# Patient Record
Sex: Male | Born: 1950
Health system: Southern US, Community
[De-identification: ages and names within clinical notes are randomized; demographics above are authoritative.]

## PROBLEM LIST (undated history)

## (undated) DIAGNOSIS — E039 Hypothyroidism, unspecified: Secondary | ICD-10-CM

## (undated) HISTORY — DX: Hypothyroidism, unspecified: E03.9

## (undated) HISTORY — PX: TONSILLECTOMY: SUR1361

---

## 2000-09-23 ENCOUNTER — Encounter: Payer: Self-pay | Admitting: Family Medicine

## 2000-09-23 ENCOUNTER — Encounter: Admission: RE | Admit: 2000-09-23 | Discharge: 2000-09-23 | Payer: Self-pay | Admitting: Family Medicine

## 2003-01-26 ENCOUNTER — Ambulatory Visit (HOSPITAL_COMMUNITY): Admission: RE | Admit: 2003-01-26 | Discharge: 2003-01-26 | Payer: Self-pay | Admitting: Neurology

## 2003-01-26 ENCOUNTER — Encounter: Payer: Self-pay | Admitting: Neurology

## 2006-05-09 ENCOUNTER — Encounter: Admission: RE | Admit: 2006-05-09 | Discharge: 2006-05-09 | Payer: Self-pay | Admitting: Family Medicine

## 2007-11-27 ENCOUNTER — Encounter: Admission: RE | Admit: 2007-11-27 | Discharge: 2007-11-27 | Payer: Self-pay | Admitting: Family Medicine

## 2008-12-09 ENCOUNTER — Ambulatory Visit: Payer: Self-pay | Admitting: Cardiology

## 2008-12-09 ENCOUNTER — Encounter: Payer: Self-pay | Admitting: Cardiology

## 2008-12-09 DIAGNOSIS — R9431 Abnormal electrocardiogram [ECG] [EKG]: Secondary | ICD-10-CM | POA: Insufficient documentation

## 2008-12-09 DIAGNOSIS — R5381 Other malaise: Secondary | ICD-10-CM | POA: Insufficient documentation

## 2008-12-09 DIAGNOSIS — R0602 Shortness of breath: Secondary | ICD-10-CM | POA: Insufficient documentation

## 2008-12-09 DIAGNOSIS — R5383 Other fatigue: Secondary | ICD-10-CM

## 2008-12-09 DIAGNOSIS — E039 Hypothyroidism, unspecified: Secondary | ICD-10-CM | POA: Insufficient documentation

## 2008-12-19 ENCOUNTER — Ambulatory Visit: Payer: Self-pay

## 2008-12-19 ENCOUNTER — Encounter: Payer: Self-pay | Admitting: Cardiology

## 2009-05-11 ENCOUNTER — Encounter (INDEPENDENT_AMBULATORY_CARE_PROVIDER_SITE_OTHER): Payer: Self-pay | Admitting: *Deleted

## 2009-07-06 IMAGING — US US ABDOMEN COMPLETE
1 series · 14 of 25 positions shown · non-contrast
Comparison: Although the images are no longer available, the report for a
ultrasound study from 09/23/2000 has been reviewed.

ABDOMEN ULTRASOUND:

CLINICAL DATA: Pulsatile epigastric mass. Question abdominal aortic aneurysm.
TECHNIQUE: Complete abdominal ultrasound examination was performed including
evaluation of the liver, gallbladder, bile ducts, pancreas, kidneys, spleen,
IVC, and abdominal aorta.

[Series 1: us abdomen complete · 0.19mm/px · 14 of 102 slices shown]
[im 1/102]
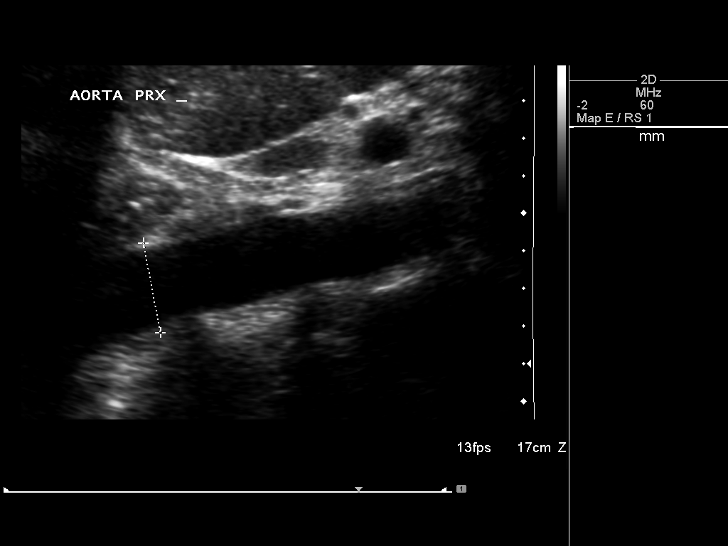
[im 9/102]
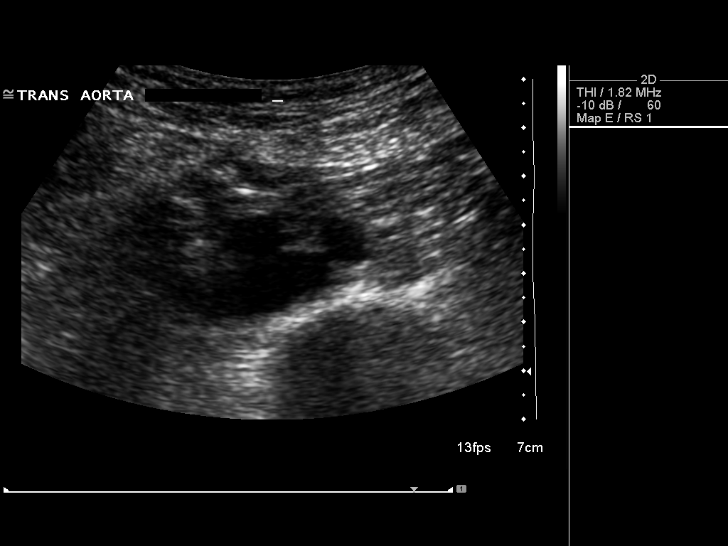
[im 17/102]
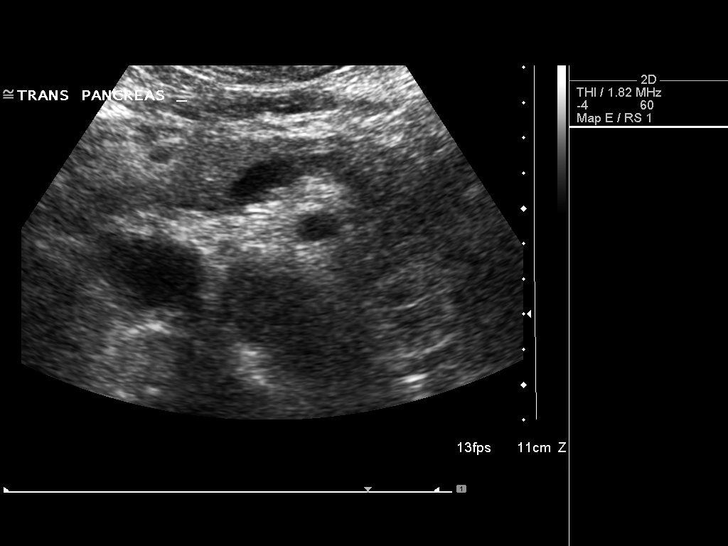
[im 26/102]
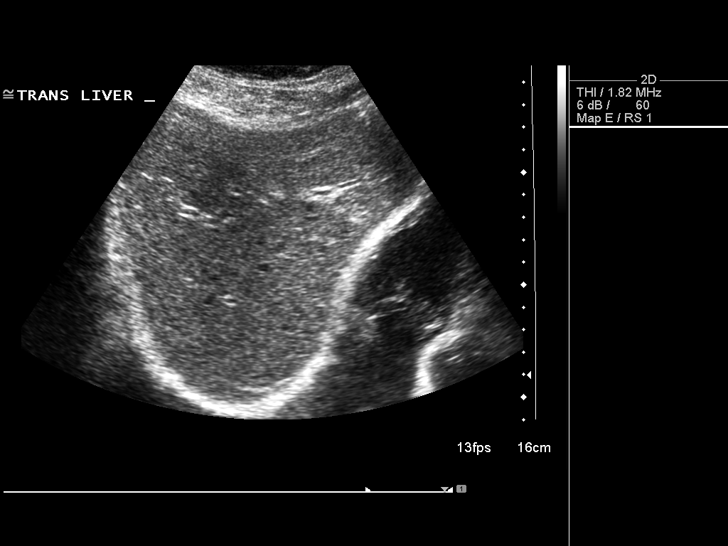
[im 34/102]
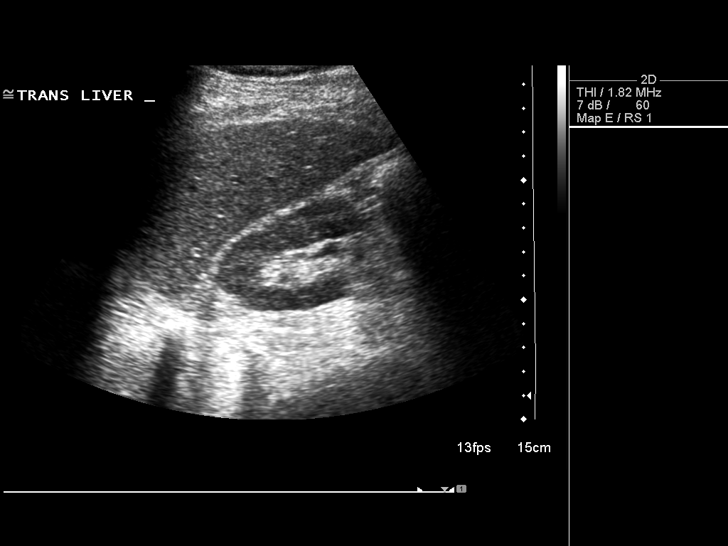
[im 38/102]
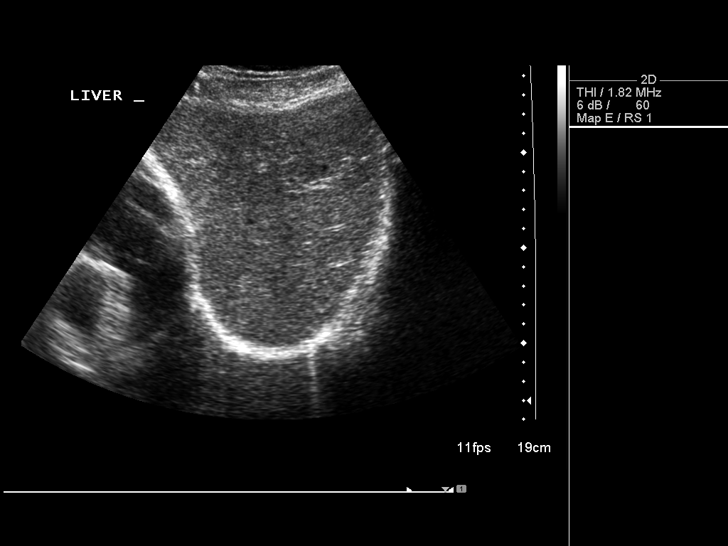
[im 47/102]
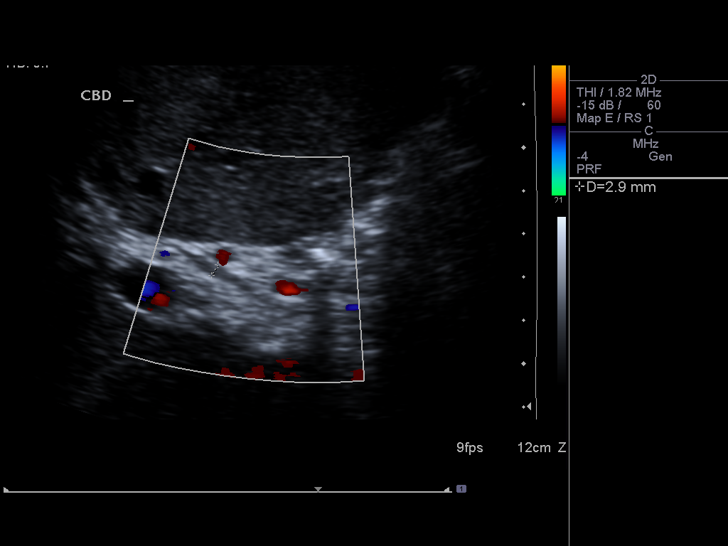
[im 55/102]
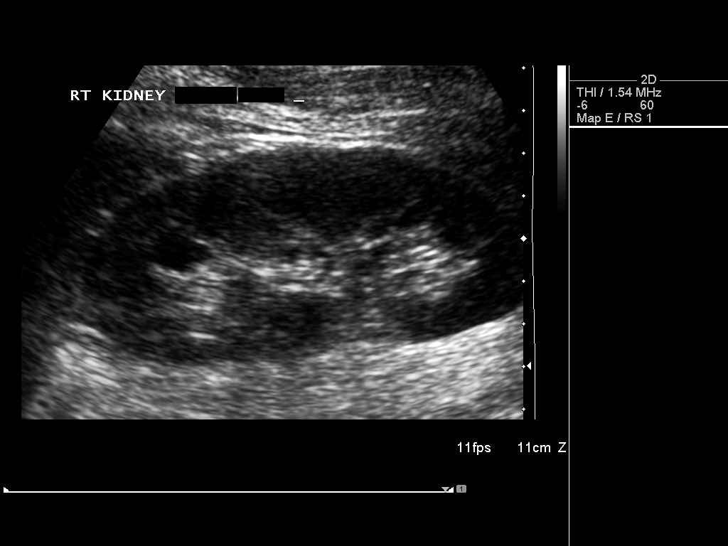
[im 64/102]
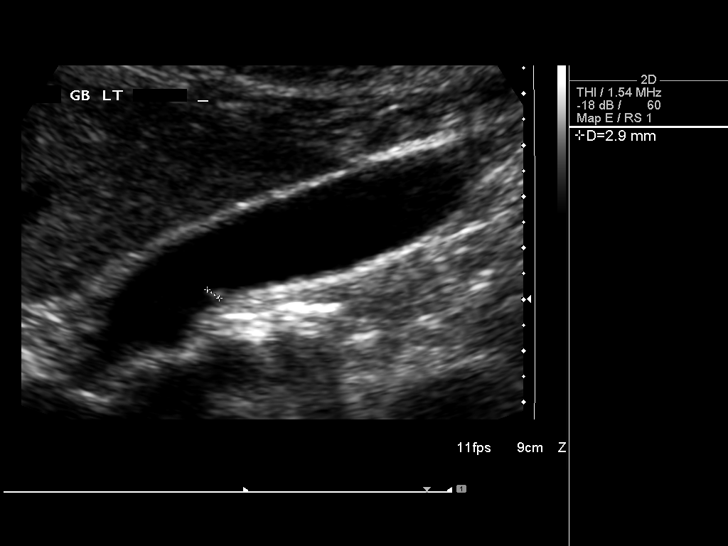
[im 68/102]
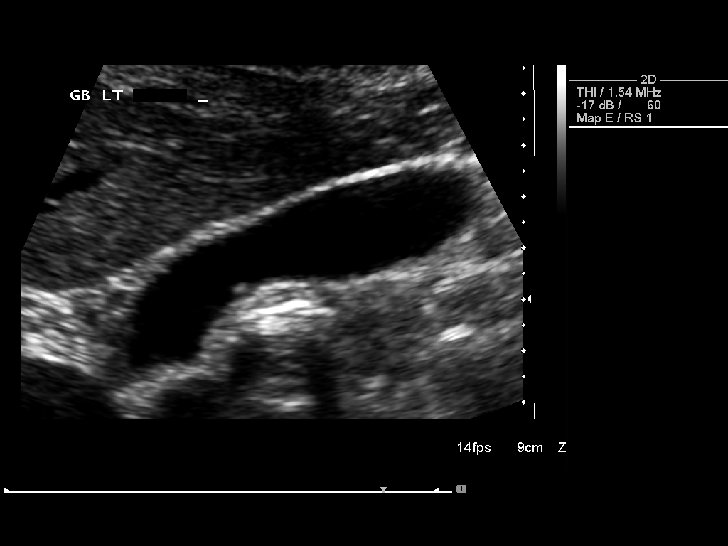
[im 76/102]
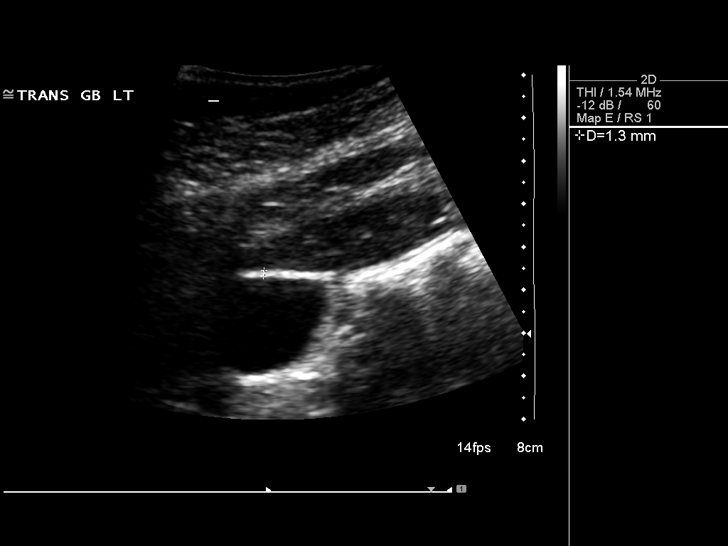
[im 85/102]
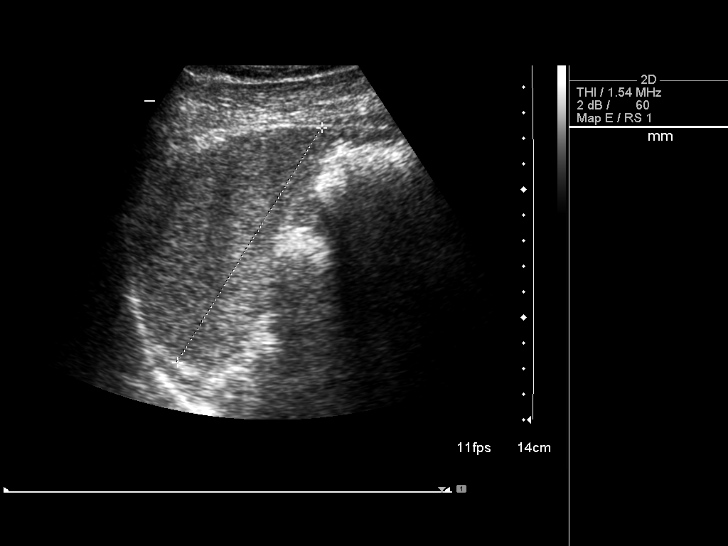
[im 93/102]
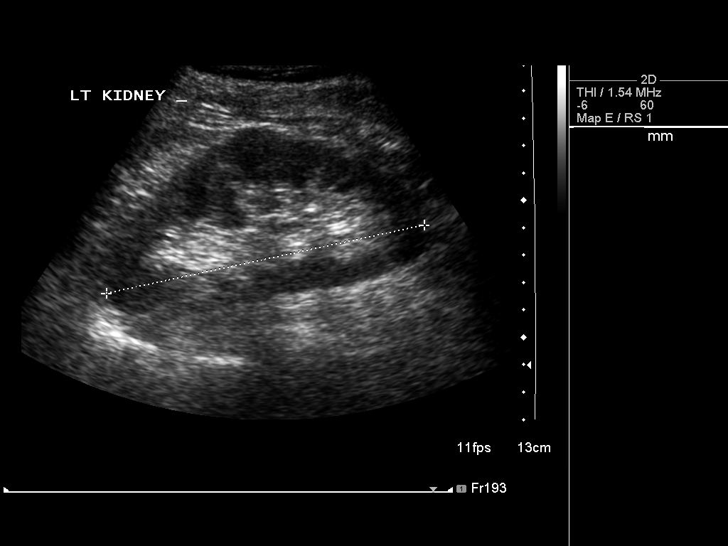
[im 102/102]
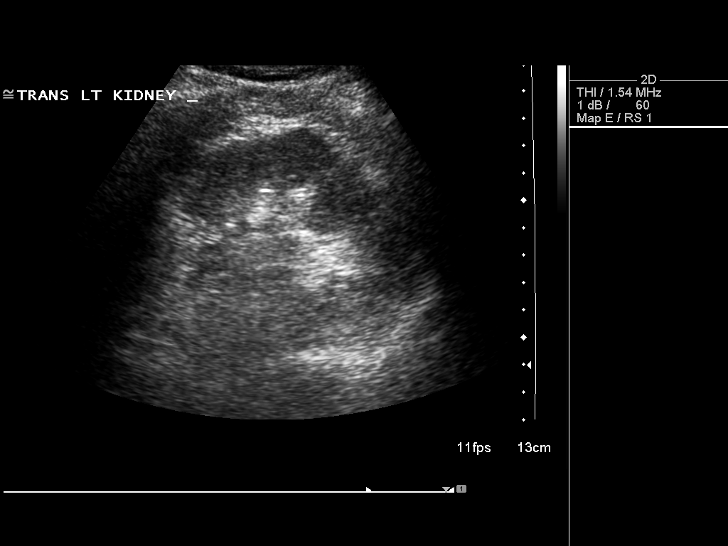

[14 of 25 positions shown; findings below may reference images not displayed]

FINDINGS: Gallbladder: Two tiny polyps are identified. Gallbladder polyp was described on
the previous study as well. There are no stones. No evidence for gallbladder
wall thickening.

Common Bile Duct:  Nondilated

Liver:  Normal

Inferior Vena Cava:  Normal

Pancreas:  Normal

Spleen: 10 mm hyperechoic focus within the superior pole of the spleen may be
related to a small calcified pseudocyst or granuloma.

Right Kidney:  11.8 cm in long axis.  14 mm cyst identified centrally in the
upper pole.

Left Kidney:  11.9 cm in long axis.  Normal.

Aorta:  Maximum obtainable abdominal aortic diameter is 2.4 cm.
IMPRESSION: No evidence for abdominal aortic aneurysm.

Tiny cholesterol polyps in the gallbladder.

Right upper pole renal cyst.

## 2009-07-21 ENCOUNTER — Encounter: Payer: Self-pay | Admitting: Cardiology

## 2010-01-19 ENCOUNTER — Ambulatory Visit: Payer: Self-pay | Admitting: Cardiology

## 2010-10-21 HISTORY — PX: COLONOSCOPY: SHX174

## 2010-11-20 NOTE — Assessment & Plan Note (Signed)
Summary: rov per pt call/lg   Primary Provider:  none  CC:  Abnormal EKG.  History of Present Illness: The patient presents for followup of an abnormal EKG. He has a right bundle branch block. Last year he had an echocardiogram to evaluate this and he was found not to have a normal left ventricular size and function and no valvular abnormalities. He returns for yearly followup of his conduction disturbance. Since that time he's had no symptomatic bradycardia arrhythmias. He's had no palpitations, presyncope or syncope. He's had no new shortness of breath though he says he does get dyspneic with exercise. He's had no chest pressure, neck or arm discomfort. He's had no weight gain or swelling. He works out aggressively but this is predominantly lifting weights rather than a rope. I did review blood work that he had done in October with an HDL of 58 and an LDL of 100.  Current Medications (verified): 1)  Zinc .... One By Mouth Daily 2)  Cytpzyme-Ad .... One By Mouth Daily 3)  Pantothenic Acid  Tabs (Pantothenic Acid) .... One By Mouth Daily 4)  Dh Ea 50 Mg .... One By Mouth Two Times A Day 5)  Vitamin C 4000mg  .... One By Mouth Daily 6)  Probiotic .... One By Mouth Every Other Day 7)  Red Yeast Rice .... One By Mouth Every Other Day 8)  Niacin .... One By Mouth Every Other Day 9)  Cialis .... One By Mouth As Needed 10)  Thyroid 2gr .... One By Mouth Daily 11)  Acyclovir 400 Mg Tabs (Acyclovir) .... One By Mouth Daily 12)  Testosterone Gel .Marland Kitchen.. 1% Daily 13)  Fish Oil .Marland Kitchen.. 3 Tablets Daily 14)  B 12 .... One By Mouth Daily 15)  Glucosamine .... Three Times A Day 16)  Calcium .... Three Times A Day 17)  Arimidex .Marland KitchenMarland Kitchen. 1 By Mouth Daily  Allergies (verified): No Known Drug Allergies  Past History:  Past Medical History: [place] spotted fever as a child Low testosterone Hypothyroidism Benign prostatic hypertrophy Recurrent HSV Right bundle branch block/left anterior fascicular  block  Past Surgical History: Tonsillectomy  Review of Systems       As stated in the HPI and negative for all other systems.   Vital Signs:  Patient profile:   60 year old male Height:      70 inches Weight:      201 pounds BMI:     28.94 Pulse rate:   71 / minute Resp:     16 per minute BP sitting:   134 / 82  (left arm)  Vitals Entered By: Marrion Coy, CNA (January 19, 2010 10:06 AM)  Physical Exam  General:  Well developed, well nourished, in no acute distress. Head:  normocephalic and atraumatic Eyes:  PERRLA/EOM intact; conjunctiva and lids normal. Mouth:  Teeth, gums and palate normal. Oral mucosa normal. Neck:  Neck supple, no JVD. No masses, thyromegaly or abnormal cervical nodes. Chest Wall:  no deformities or breast masses noted Lungs:  Clear bilaterally to auscultation and percussion. Abdomen:  Bowel sounds positive; abdomen soft and non-tender without masses, organomegaly, or hernias noted. No hepatosplenomegaly. Msk:  Back normal, normal gait. Muscle strength and tone normal. Extremities:  No clubbing or cyanosis. Neurologic:  Alert and oriented x 3. Skin:  Intact without lesions or rashes. Cervical Nodes:  no significant adenopathy Axillary Nodes:  no significant adenopathy Inguinal Nodes:  no significant adenopathy Psych:  Normal affect.   Detailed Cardiovascular  Exam  Neck    Carotids: Carotids full and equal bilaterally without bruits.      Neck Veins: Normal, no JVD.    Heart    Inspection: no deformities or lifts noted.      Palpation: normal PMI with no thrills palpable.      Auscultation: regular rate and rhythm, S1, S2 without murmurs, rubs, gallops, or clicks.    Vascular    Abdominal Aorta: no palpable masses, pulsations, or audible bruits.      Femoral Pulses: normal femoral pulses bilaterally.      Pedal Pulses: normal pedal pulses bilaterally.      Radial Pulses: normal radial pulses bilaterally.      Peripheral Circulation: no  clubbing, cyanosis, or edema noted with normal capillary refill.     Impression & Recommendations:  Problem # 1:  ABNORMAL ELECTROCARDIOGRAM (ICD-794.31) We discuss this again at length. At this point there is no indication for the need for further testing. He has no new complaints and has a structurally normal heart. He understands the symptoms that could occur if he has worsening conduction disturbance. Orders: EKG w/ Interpretation (93000)  Problem # 2:  FATIGUE (ICD-780.79) This he is a continued complaint. He's had extensive laboratory workup to include hormonal evaluation and thyroid. His TSH is actually low. At this point I do not suggest a cardiac etiology. On a side note we spent quite a bit of time presents is greater than 30 minutes of this appointment) discussing the science behind the many nutraceuticals that he takes.  Problem # 3:  DYSPNEA (ICD-786.05) This is mild and not progressive.  I asked him to increase his exercise to see if this improves.  We also discussed the need to add aerobic exercise to reduce cardiovascular risk.  Patient Instructions: 1)  Your physician recommends that you continue on your current medications as directed. Please refer to the Current Medication list given to you today. 2)  Your physician recommends that you schedule a follow-up appointment in: 1 year

## 2012-11-02 ENCOUNTER — Encounter: Payer: Self-pay | Admitting: Internal Medicine

## 2013-07-12 ENCOUNTER — Encounter: Payer: Self-pay | Admitting: Internal Medicine

## 2015-10-24 DIAGNOSIS — R7989 Other specified abnormal findings of blood chemistry: Secondary | ICD-10-CM | POA: Diagnosis not present

## 2016-02-14 DIAGNOSIS — R69 Illness, unspecified: Secondary | ICD-10-CM | POA: Diagnosis not present

## 2016-02-20 DIAGNOSIS — S8391XA Sprain of unspecified site of right knee, initial encounter: Secondary | ICD-10-CM | POA: Diagnosis not present

## 2016-02-20 DIAGNOSIS — M546 Pain in thoracic spine: Secondary | ICD-10-CM | POA: Diagnosis not present

## 2016-02-20 DIAGNOSIS — S335XXA Sprain of ligaments of lumbar spine, initial encounter: Secondary | ICD-10-CM | POA: Diagnosis not present

## 2016-02-20 DIAGNOSIS — S134XXA Sprain of ligaments of cervical spine, initial encounter: Secondary | ICD-10-CM | POA: Diagnosis not present

## 2016-04-16 DIAGNOSIS — E559 Vitamin D deficiency, unspecified: Secondary | ICD-10-CM | POA: Diagnosis not present

## 2016-04-16 DIAGNOSIS — R7989 Other specified abnormal findings of blood chemistry: Secondary | ICD-10-CM | POA: Diagnosis not present

## 2016-04-16 DIAGNOSIS — R5383 Other fatigue: Secondary | ICD-10-CM | POA: Diagnosis not present

## 2016-07-08 DIAGNOSIS — D485 Neoplasm of uncertain behavior of skin: Secondary | ICD-10-CM | POA: Diagnosis not present

## 2016-07-08 DIAGNOSIS — L821 Other seborrheic keratosis: Secondary | ICD-10-CM | POA: Diagnosis not present

## 2016-07-08 DIAGNOSIS — L814 Other melanin hyperpigmentation: Secondary | ICD-10-CM | POA: Diagnosis not present

## 2016-10-16 DIAGNOSIS — S335XXA Sprain of ligaments of lumbar spine, initial encounter: Secondary | ICD-10-CM | POA: Diagnosis not present

## 2016-10-16 DIAGNOSIS — M546 Pain in thoracic spine: Secondary | ICD-10-CM | POA: Diagnosis not present

## 2016-10-16 DIAGNOSIS — S8391XA Sprain of unspecified site of right knee, initial encounter: Secondary | ICD-10-CM | POA: Diagnosis not present

## 2016-10-16 DIAGNOSIS — S134XXA Sprain of ligaments of cervical spine, initial encounter: Secondary | ICD-10-CM | POA: Diagnosis not present

## 2016-10-17 DIAGNOSIS — R7989 Other specified abnormal findings of blood chemistry: Secondary | ICD-10-CM | POA: Diagnosis not present

## 2016-10-17 DIAGNOSIS — R5383 Other fatigue: Secondary | ICD-10-CM | POA: Diagnosis not present

## 2016-10-17 DIAGNOSIS — R972 Elevated prostate specific antigen [PSA]: Secondary | ICD-10-CM | POA: Diagnosis not present

## 2016-10-17 DIAGNOSIS — E559 Vitamin D deficiency, unspecified: Secondary | ICD-10-CM | POA: Diagnosis not present

## 2017-03-25 DIAGNOSIS — E291 Testicular hypofunction: Secondary | ICD-10-CM | POA: Diagnosis not present

## 2017-03-25 DIAGNOSIS — N5201 Erectile dysfunction due to arterial insufficiency: Secondary | ICD-10-CM | POA: Diagnosis not present

## 2017-04-15 DIAGNOSIS — Z6826 Body mass index (BMI) 26.0-26.9, adult: Secondary | ICD-10-CM | POA: Diagnosis not present

## 2017-04-15 DIAGNOSIS — M7022 Olecranon bursitis, left elbow: Secondary | ICD-10-CM | POA: Diagnosis not present

## 2017-05-14 DIAGNOSIS — J3089 Other allergic rhinitis: Secondary | ICD-10-CM | POA: Diagnosis not present

## 2017-05-14 DIAGNOSIS — R5383 Other fatigue: Secondary | ICD-10-CM | POA: Diagnosis not present

## 2017-05-14 DIAGNOSIS — Z79890 Hormone replacement therapy (postmenopausal): Secondary | ICD-10-CM | POA: Diagnosis not present

## 2017-05-14 DIAGNOSIS — E7211 Homocystinuria: Secondary | ICD-10-CM | POA: Diagnosis not present

## 2017-05-14 DIAGNOSIS — D899 Disorder involving the immune mechanism, unspecified: Secondary | ICD-10-CM | POA: Diagnosis not present

## 2017-05-14 DIAGNOSIS — R748 Abnormal levels of other serum enzymes: Secondary | ICD-10-CM | POA: Diagnosis not present

## 2017-05-14 DIAGNOSIS — R7982 Elevated C-reactive protein (CRP): Secondary | ICD-10-CM | POA: Diagnosis not present

## 2017-05-14 DIAGNOSIS — E291 Testicular hypofunction: Secondary | ICD-10-CM | POA: Diagnosis not present

## 2017-05-14 DIAGNOSIS — Z125 Encounter for screening for malignant neoplasm of prostate: Secondary | ICD-10-CM | POA: Diagnosis not present

## 2017-07-01 DIAGNOSIS — H25011 Cortical age-related cataract, right eye: Secondary | ICD-10-CM | POA: Diagnosis not present

## 2017-07-01 DIAGNOSIS — H52229 Regular astigmatism, unspecified eye: Secondary | ICD-10-CM | POA: Diagnosis not present

## 2017-07-01 DIAGNOSIS — Z0100 Encounter for examination of eyes and vision without abnormal findings: Secondary | ICD-10-CM | POA: Diagnosis not present

## 2017-08-08 DIAGNOSIS — E7211 Homocystinuria: Secondary | ICD-10-CM | POA: Diagnosis not present

## 2017-08-08 DIAGNOSIS — Z79890 Hormone replacement therapy (postmenopausal): Secondary | ICD-10-CM | POA: Diagnosis not present

## 2017-08-08 DIAGNOSIS — E291 Testicular hypofunction: Secondary | ICD-10-CM | POA: Diagnosis not present

## 2017-08-08 DIAGNOSIS — N401 Enlarged prostate with lower urinary tract symptoms: Secondary | ICD-10-CM | POA: Diagnosis not present

## 2017-08-08 DIAGNOSIS — E039 Hypothyroidism, unspecified: Secondary | ICD-10-CM | POA: Diagnosis not present

## 2017-10-07 DIAGNOSIS — E291 Testicular hypofunction: Secondary | ICD-10-CM | POA: Diagnosis not present

## 2017-10-08 DIAGNOSIS — S335XXA Sprain of ligaments of lumbar spine, initial encounter: Secondary | ICD-10-CM | POA: Diagnosis not present

## 2017-10-08 DIAGNOSIS — S134XXA Sprain of ligaments of cervical spine, initial encounter: Secondary | ICD-10-CM | POA: Diagnosis not present

## 2017-10-08 DIAGNOSIS — M546 Pain in thoracic spine: Secondary | ICD-10-CM | POA: Diagnosis not present

## 2017-11-12 DIAGNOSIS — R69 Illness, unspecified: Secondary | ICD-10-CM | POA: Diagnosis not present

## 2017-11-26 DIAGNOSIS — E559 Vitamin D deficiency, unspecified: Secondary | ICD-10-CM | POA: Diagnosis not present

## 2017-11-26 DIAGNOSIS — E291 Testicular hypofunction: Secondary | ICD-10-CM | POA: Diagnosis not present

## 2017-11-26 DIAGNOSIS — E039 Hypothyroidism, unspecified: Secondary | ICD-10-CM | POA: Diagnosis not present

## 2017-11-26 DIAGNOSIS — Z79890 Hormone replacement therapy (postmenopausal): Secondary | ICD-10-CM | POA: Diagnosis not present

## 2017-12-16 DIAGNOSIS — R69 Illness, unspecified: Secondary | ICD-10-CM | POA: Diagnosis not present

## 2018-03-10 DIAGNOSIS — E039 Hypothyroidism, unspecified: Secondary | ICD-10-CM | POA: Diagnosis not present

## 2018-03-10 DIAGNOSIS — E291 Testicular hypofunction: Secondary | ICD-10-CM | POA: Diagnosis not present

## 2018-04-16 DIAGNOSIS — E291 Testicular hypofunction: Secondary | ICD-10-CM | POA: Diagnosis not present

## 2018-04-16 DIAGNOSIS — M47816 Spondylosis without myelopathy or radiculopathy, lumbar region: Secondary | ICD-10-CM | POA: Diagnosis not present

## 2018-04-16 DIAGNOSIS — E039 Hypothyroidism, unspecified: Secondary | ICD-10-CM | POA: Diagnosis not present

## 2018-04-16 DIAGNOSIS — M9901 Segmental and somatic dysfunction of cervical region: Secondary | ICD-10-CM | POA: Diagnosis not present

## 2018-04-16 DIAGNOSIS — M9903 Segmental and somatic dysfunction of lumbar region: Secondary | ICD-10-CM | POA: Diagnosis not present

## 2018-04-16 DIAGNOSIS — M546 Pain in thoracic spine: Secondary | ICD-10-CM | POA: Diagnosis not present

## 2018-04-16 DIAGNOSIS — M9902 Segmental and somatic dysfunction of thoracic region: Secondary | ICD-10-CM | POA: Diagnosis not present

## 2018-04-16 DIAGNOSIS — Z79890 Hormone replacement therapy (postmenopausal): Secondary | ICD-10-CM | POA: Diagnosis not present

## 2018-04-16 DIAGNOSIS — M47812 Spondylosis without myelopathy or radiculopathy, cervical region: Secondary | ICD-10-CM | POA: Diagnosis not present

## 2018-04-22 DIAGNOSIS — D225 Melanocytic nevi of trunk: Secondary | ICD-10-CM | POA: Diagnosis not present

## 2018-04-22 DIAGNOSIS — D485 Neoplasm of uncertain behavior of skin: Secondary | ICD-10-CM | POA: Diagnosis not present

## 2018-04-22 DIAGNOSIS — L720 Epidermal cyst: Secondary | ICD-10-CM | POA: Diagnosis not present

## 2018-07-03 DIAGNOSIS — Z6826 Body mass index (BMI) 26.0-26.9, adult: Secondary | ICD-10-CM | POA: Diagnosis not present

## 2018-07-03 DIAGNOSIS — G44219 Episodic tension-type headache, not intractable: Secondary | ICD-10-CM | POA: Diagnosis not present

## 2018-07-03 DIAGNOSIS — J309 Allergic rhinitis, unspecified: Secondary | ICD-10-CM | POA: Diagnosis not present

## 2018-07-03 DIAGNOSIS — H538 Other visual disturbances: Secondary | ICD-10-CM | POA: Diagnosis not present

## 2018-07-13 DIAGNOSIS — H25011 Cortical age-related cataract, right eye: Secondary | ICD-10-CM | POA: Diagnosis not present

## 2018-07-13 DIAGNOSIS — H52229 Regular astigmatism, unspecified eye: Secondary | ICD-10-CM | POA: Diagnosis not present

## 2018-07-22 DIAGNOSIS — E781 Pure hyperglyceridemia: Secondary | ICD-10-CM | POA: Diagnosis not present

## 2018-07-22 DIAGNOSIS — E7889 Other lipoprotein metabolism disorders: Secondary | ICD-10-CM | POA: Diagnosis not present

## 2018-07-22 DIAGNOSIS — Z13220 Encounter for screening for lipoid disorders: Secondary | ICD-10-CM | POA: Diagnosis not present

## 2018-07-22 DIAGNOSIS — Z0000 Encounter for general adult medical examination without abnormal findings: Secondary | ICD-10-CM | POA: Diagnosis not present

## 2018-07-22 DIAGNOSIS — Z6826 Body mass index (BMI) 26.0-26.9, adult: Secondary | ICD-10-CM | POA: Diagnosis not present

## 2018-08-03 DIAGNOSIS — M546 Pain in thoracic spine: Secondary | ICD-10-CM | POA: Diagnosis not present

## 2018-08-03 DIAGNOSIS — M9902 Segmental and somatic dysfunction of thoracic region: Secondary | ICD-10-CM | POA: Diagnosis not present

## 2018-08-03 DIAGNOSIS — M47816 Spondylosis without myelopathy or radiculopathy, lumbar region: Secondary | ICD-10-CM | POA: Diagnosis not present

## 2018-08-03 DIAGNOSIS — M9903 Segmental and somatic dysfunction of lumbar region: Secondary | ICD-10-CM | POA: Diagnosis not present

## 2018-08-03 DIAGNOSIS — M9901 Segmental and somatic dysfunction of cervical region: Secondary | ICD-10-CM | POA: Diagnosis not present

## 2018-08-03 DIAGNOSIS — M47812 Spondylosis without myelopathy or radiculopathy, cervical region: Secondary | ICD-10-CM | POA: Diagnosis not present

## 2018-08-14 DIAGNOSIS — E531 Pyridoxine deficiency: Secondary | ICD-10-CM | POA: Diagnosis not present

## 2018-08-14 DIAGNOSIS — D513 Other dietary vitamin B12 deficiency anemia: Secondary | ICD-10-CM | POA: Diagnosis not present

## 2018-08-14 DIAGNOSIS — E039 Hypothyroidism, unspecified: Secondary | ICD-10-CM | POA: Diagnosis not present

## 2018-08-14 DIAGNOSIS — D528 Other folate deficiency anemias: Secondary | ICD-10-CM | POA: Diagnosis not present

## 2018-08-14 DIAGNOSIS — E559 Vitamin D deficiency, unspecified: Secondary | ICD-10-CM | POA: Diagnosis not present

## 2018-08-14 DIAGNOSIS — R5382 Chronic fatigue, unspecified: Secondary | ICD-10-CM | POA: Diagnosis not present

## 2018-08-14 DIAGNOSIS — E7141 Primary carnitine deficiency: Secondary | ICD-10-CM | POA: Diagnosis not present

## 2018-09-10 DIAGNOSIS — E7212 Methylenetetrahydrofolate reductase deficiency: Secondary | ICD-10-CM | POA: Diagnosis not present

## 2018-09-10 DIAGNOSIS — E782 Mixed hyperlipidemia: Secondary | ICD-10-CM | POA: Diagnosis not present

## 2018-09-10 DIAGNOSIS — Z79890 Hormone replacement therapy (postmenopausal): Secondary | ICD-10-CM | POA: Diagnosis not present

## 2018-09-10 DIAGNOSIS — E559 Vitamin D deficiency, unspecified: Secondary | ICD-10-CM | POA: Diagnosis not present

## 2018-09-10 DIAGNOSIS — E756 Lipid storage disorder, unspecified: Secondary | ICD-10-CM | POA: Diagnosis not present

## 2018-09-10 DIAGNOSIS — E291 Testicular hypofunction: Secondary | ICD-10-CM | POA: Diagnosis not present

## 2018-09-10 DIAGNOSIS — R5382 Chronic fatigue, unspecified: Secondary | ICD-10-CM | POA: Diagnosis not present

## 2018-09-10 DIAGNOSIS — E039 Hypothyroidism, unspecified: Secondary | ICD-10-CM | POA: Diagnosis not present

## 2018-11-18 DIAGNOSIS — R69 Illness, unspecified: Secondary | ICD-10-CM | POA: Diagnosis not present

## 2018-11-23 DIAGNOSIS — R69 Illness, unspecified: Secondary | ICD-10-CM | POA: Diagnosis not present

## 2018-12-10 DIAGNOSIS — M546 Pain in thoracic spine: Secondary | ICD-10-CM | POA: Diagnosis not present

## 2018-12-10 DIAGNOSIS — M47816 Spondylosis without myelopathy or radiculopathy, lumbar region: Secondary | ICD-10-CM | POA: Diagnosis not present

## 2018-12-10 DIAGNOSIS — M9902 Segmental and somatic dysfunction of thoracic region: Secondary | ICD-10-CM | POA: Diagnosis not present

## 2018-12-10 DIAGNOSIS — M47812 Spondylosis without myelopathy or radiculopathy, cervical region: Secondary | ICD-10-CM | POA: Diagnosis not present

## 2018-12-10 DIAGNOSIS — M9901 Segmental and somatic dysfunction of cervical region: Secondary | ICD-10-CM | POA: Diagnosis not present

## 2018-12-10 DIAGNOSIS — M9903 Segmental and somatic dysfunction of lumbar region: Secondary | ICD-10-CM | POA: Diagnosis not present

## 2019-01-28 DIAGNOSIS — E291 Testicular hypofunction: Secondary | ICD-10-CM | POA: Diagnosis not present

## 2019-01-28 DIAGNOSIS — Z79890 Hormone replacement therapy (postmenopausal): Secondary | ICD-10-CM | POA: Diagnosis not present

## 2019-01-28 DIAGNOSIS — R5383 Other fatigue: Secondary | ICD-10-CM | POA: Diagnosis not present

## 2019-03-30 DIAGNOSIS — M9902 Segmental and somatic dysfunction of thoracic region: Secondary | ICD-10-CM | POA: Diagnosis not present

## 2019-03-30 DIAGNOSIS — M546 Pain in thoracic spine: Secondary | ICD-10-CM | POA: Diagnosis not present

## 2019-03-30 DIAGNOSIS — M47812 Spondylosis without myelopathy or radiculopathy, cervical region: Secondary | ICD-10-CM | POA: Diagnosis not present

## 2019-03-30 DIAGNOSIS — M47816 Spondylosis without myelopathy or radiculopathy, lumbar region: Secondary | ICD-10-CM | POA: Diagnosis not present

## 2019-03-30 DIAGNOSIS — M9903 Segmental and somatic dysfunction of lumbar region: Secondary | ICD-10-CM | POA: Diagnosis not present

## 2019-03-30 DIAGNOSIS — M9901 Segmental and somatic dysfunction of cervical region: Secondary | ICD-10-CM | POA: Diagnosis not present

## 2019-03-31 DIAGNOSIS — E039 Hypothyroidism, unspecified: Secondary | ICD-10-CM | POA: Diagnosis not present

## 2019-03-31 DIAGNOSIS — R079 Chest pain, unspecified: Secondary | ICD-10-CM | POA: Diagnosis not present

## 2019-03-31 DIAGNOSIS — Z6825 Body mass index (BMI) 25.0-25.9, adult: Secondary | ICD-10-CM | POA: Diagnosis not present

## 2019-03-31 DIAGNOSIS — I4510 Unspecified right bundle-branch block: Secondary | ICD-10-CM | POA: Diagnosis not present

## 2019-04-05 ENCOUNTER — Encounter: Payer: Self-pay | Admitting: *Deleted

## 2019-04-06 ENCOUNTER — Encounter: Payer: Self-pay | Admitting: Cardiology

## 2019-04-06 NOTE — Progress Notes (Signed)
Cardiology Office Note  Date: 04/07/2019   ID: Jonathan Boone, DOB 1951/03/30, MRN [id number]  PCP:  Jonathan Levy, PA  Consulting Cardiologist:  Rozann Lesches, MD Electrophysiologist:  None   Chief Complaint  Patient presents with  . Right scapular pain and diaphoresis    History of Present Illness: Jonathan Boone is a 68 y.o. male referred for cardiology consultation by Mr. Worley PA-C for the evaluation intermittent diaphoresis as well as right shoulder discomfort.  He states that a little over a week ago he was out shooting target practice and began to experience a right scapular/posterior thoracic discomfort.  This waxed and waned, recurred at nighttime and woke him up and was associated with diaphoresis.  He has had intermittent episodes of this during the week but not progressive, he states that it almost feels like he pulled a muscle.  He does lift weights, states that he increased his weight last week and was feeling good when he exercise.  He remains active, cut his grass this past Friday without symptoms.  He has not undergone any ischemic testing and has no history of known heart disease.  Records indicate evaluation with Dr. Percival Spanish back in 2011 for ECG with right bundle branch block pattern.  Previous echocardiogram from 2010 revealed normal LVEF at 60% with mildly calcified aortic valve with mild left atrial enlargement.  He reports no known history of hypertension, diabetes mellitus, or hyperlipidemia.  He is on treatment for hypothyroidism.  Past Medical History:  Diagnosis Date  . Hypothyroidism     Past Surgical History:  Procedure Laterality Date  . COLONOSCOPY  2012  . TONSILLECTOMY      Current Outpatient Medications  Medication Sig Dispense Refill  . acyclovir (ZOVIRAX) 400 MG tablet Take 400 mg by mouth daily.    . Alprostadil (PROSTAGLANDIN E1) POWD by Does not apply route.    Marland Kitchen arginine 500 MG tablet Take 500 mg by mouth daily.    . Magnesium  Gluconate (MAGNESIUM 27 PO) Take by mouth.    . Multiple Vitamin (MULTIVITAMIN) tablet Take 1 tablet by mouth daily.    . Multiple Vitamins-Minerals (PRESERVISION AREDS PO) Take by mouth.    . TESTOSTERONE PROPIONATE TD Place onto the skin.    Marland Kitchen thyroid (ARMOUR) 65 MG tablet Take 65 mg by mouth daily.    Marland Kitchen VITAMIN D PO Take by mouth.     No current facility-administered medications for this visit.    Allergies:  Patient has no known allergies.   Social History: The patient  reports that he has never smoked. He has never used smokeless tobacco. He reports that he does not drink alcohol or use drugs.   Family History: The patient's family history includes Alcohol abuse in his paternal grandfather; Diabetes Mellitus II in his mother; Heart attack in his paternal grandfather; Leukemia in his father.   ROS:  Please see the history of present illness. Otherwise, complete review of systems is positive for none.  All other systems are reviewed and negative.   Physical Exam: VS:  BP 124/78   Pulse 70   Ht 5\' 10"  (1.778 m)   Wt 188 lb (85.3 kg)   SpO2 97%   BMI 26.98 kg/m , BMI Body mass index is 26.98 kg/m.  Wt Readings from Last 3 Encounters:  04/07/19 188 lb (85.3 kg)  03/31/19 184 lb 4.8 oz (83.6 kg)  12/09/08 188 lb (85.3 kg)    General: Patient appears comfortable at  rest. HEENT: Conjunctiva and lids normal, oropharynx clear. Neck: Supple, no elevated JVP or carotid bruits, no thyromegaly. Lungs: Clear to auscultation, nonlabored breathing at rest. Cardiac: Regular rate and rhythm, no S3 or significant systolic murmur, no pericardial rub. Abdomen: Soft, nontender, bowel sounds present, no guarding or rebound. Extremities: No pitting edema, distal pulses 2+. Skin: Warm and dry. Musculoskeletal: No kyphosis. Neuropsychiatric: Alert and oriented x3, affect grossly appropriate.  ECG:  An ECG dated 03/31/2019 was personally reviewed today and demonstrated:  Normal sinus rhythm with  right bundle branch block and left anterior fascicular block based on axis.  Recent Labwork:  June 2020: BUN 18, creatinine 0.95, potassium 4.8, AST 12, ALT 12, hemoglobin 16.9, platelets 245, TSH 0.673  Other Studies Reviewed Today:  Echocardiogram 02/18/2009: SUMMARY  - Overall left ventricular systolic function was normal. Left     ventricular ejection fraction was estimated to be 60 %.  - The aortic valve was mildly calcified.  - The left atrium was mildly dilated.   Assessment and Plan:  1.  Recent recurrent episodes of right scapular discomfort with intermittent diaphoresis in a 68 year old male.  He reports no purely exertional component but does have some baseline degree of shortness of breath.  I reviewed his ECG which shows an old right bundle branch block, also left anterior fascicular block.  He has no known history of ischemic heart disease but has not undergone any objective ischemic testing.  Plan is to proceed with an echocardiogram for cardiac structural assessment and also an exercise Myoview (he prefers to avoid pharmacologic stress testing).  2.  Hypothyroidism followed by PCP, currently on Armour Thyroid.  Recent TSH normal range.  Medication Adjustments/Labs and Tests Ordered: Current medicines are reviewed at length with the patient today.  Concerns regarding medicines are outlined above.   Tests Ordered: Orders Placed This Encounter  Procedures  . NM Myocar Multi W/Spect W/Wall Motion / EF  . ECHOCARDIOGRAM COMPLETE    Medication Changes: No orders of the defined types were placed in this encounter.   Disposition:  Follow up test results.  Signed, Satira Sark, MD, [facility] 04/07/2019 9:32 AM    [facility] [address], [place], [place] [address] Phone: [phone]; Fax: [phone]

## 2019-04-07 ENCOUNTER — Other Ambulatory Visit: Payer: Self-pay

## 2019-04-07 ENCOUNTER — Encounter: Payer: Self-pay | Admitting: Cardiology

## 2019-04-07 ENCOUNTER — Encounter: Payer: Self-pay | Admitting: *Deleted

## 2019-04-07 ENCOUNTER — Ambulatory Visit: Payer: Medicare HMO | Admitting: Cardiology

## 2019-04-07 ENCOUNTER — Telehealth: Payer: Self-pay | Admitting: Cardiology

## 2019-04-07 VITALS — BP 124/78 | HR 70 | Ht 70.0 in | Wt 188.0 lb

## 2019-04-07 DIAGNOSIS — R9431 Abnormal electrocardiogram [ECG] [EKG]: Secondary | ICD-10-CM | POA: Diagnosis not present

## 2019-04-07 DIAGNOSIS — E039 Hypothyroidism, unspecified: Secondary | ICD-10-CM | POA: Diagnosis not present

## 2019-04-07 DIAGNOSIS — R0789 Other chest pain: Secondary | ICD-10-CM

## 2019-04-07 DIAGNOSIS — R0602 Shortness of breath: Secondary | ICD-10-CM | POA: Diagnosis not present

## 2019-04-07 NOTE — Telephone Encounter (Signed)
Pre-cert Verification for the following procedure    Exercise Myoview & Echo scheduled for 04-08-2019 at [facility].

## 2019-04-07 NOTE — Patient Instructions (Addendum)
Medication Instructions:   Your physician recommends that you continue on your current medications as directed. Please refer to the Current Medication list given to you today.  Labwork:  NONE  Testing/Procedures: Your physician has requested that you have an echocardiogram. Echocardiography is a painless test that uses sound waves to create images of your heart. It provides your doctor with information about the size and shape of your heart and how well your heart's chambers and valves are working. This procedure takes approximately one hour. There are no restrictions for this procedure. Your physician has requested that you have en exercise stress myoview. For further information please visit [email or web address]. Please follow instruction sheet, as given.  Follow-Up:  Your physician recommends that you schedule a follow-up appointment in: pending test results.  Any Other Special Instructions Will Be Listed Below (If Applicable).  If you need a refill on your cardiac medications before your next appointment, please call your pharmacy.

## 2019-04-08 ENCOUNTER — Encounter (HOSPITAL_COMMUNITY)
Admission: RE | Admit: 2019-04-08 | Discharge: 2019-04-08 | Disposition: A | Discharge status: Home / Self Care | Payer: Medicare HMO | Source: Ambulatory Visit | Attending: Cardiology | Admitting: Cardiology

## 2019-04-08 ENCOUNTER — Ambulatory Visit (HOSPITAL_COMMUNITY)
Admission: RE | Admit: 2019-04-08 | Discharge: 2019-04-08 | Disposition: A | Discharge status: Home / Self Care | Payer: Medicare HMO | Source: Ambulatory Visit | Attending: Cardiology | Admitting: Cardiology

## 2019-04-08 ENCOUNTER — Encounter (HOSPITAL_BASED_OUTPATIENT_CLINIC_OR_DEPARTMENT_OTHER)
Admission: RE | Admit: 2019-04-08 | Discharge: 2019-04-08 | Disposition: A | Discharge status: Home / Self Care | Payer: Medicare HMO | Source: Ambulatory Visit | Attending: Cardiology | Admitting: Cardiology

## 2019-04-08 DIAGNOSIS — R0602 Shortness of breath: Secondary | ICD-10-CM | POA: Diagnosis not present

## 2019-04-08 DIAGNOSIS — R0789 Other chest pain: Secondary | ICD-10-CM | POA: Insufficient documentation

## 2019-04-08 LAB — NM MYOCAR MULTI W/SPECT W/WALL MOTION / EF
Estimated workload: 10.1 METS
Exercise duration (min): 8 min
Exercise duration (sec): 55 s
LV dias vol: 95 mL (ref 62–150)
LV sys vol: 35 mL
MPHR: 152 {beats}/min
Peak HR: 166 {beats}/min
Percent HR: 109 %
RATE: 0.35
RPE: 13
Rest HR: 65 {beats}/min
SDS: 0
SRS: 1
SSS: 1
TID: 0.89

## 2019-04-08 MED ORDER — TECHNETIUM TC 99M TETROFOSMIN IV KIT
10.2000 | PACK | Freq: Once | INTRAVENOUS | Status: AC | PRN
  Administered 2019-04-08: 10.2 via INTRAVENOUS

## 2019-04-08 MED ORDER — TECHNETIUM TC 99M TETROFOSMIN IV KIT
30.0000 | PACK | Freq: Once | INTRAVENOUS | Status: AC | PRN
  Administered 2019-04-08: 29.4 via INTRAVENOUS

## 2019-04-08 MED ORDER — REGADENOSON 0.4 MG/5ML IV SOLN
INTRAVENOUS | Status: AC
  Filled 2019-04-08: qty 5

## 2019-04-08 MED ORDER — SODIUM CHLORIDE 0.9% FLUSH
INTRAVENOUS | Status: AC
  Administered 2019-04-08: 10 mL via INTRAVENOUS
  Filled 2019-04-08: qty 10

## 2019-04-08 NOTE — Progress Notes (Signed)
*  PRELIMINARY RESULTS* Echocardiogram 2D Echocardiogram has been performed.  Jonathan Boone 04/08/2019, 12:27 PM

## 2019-04-09 ENCOUNTER — Telehealth: Payer: Self-pay | Admitting: *Deleted

## 2019-04-09 NOTE — Telephone Encounter (Signed)
-----   Message from Satira Sark, MD sent at 04/09/2019 10:01 AM EDT ----- Results reviewed.  Please let him know that the stress test was low risk, did not show clear evidence of obstructive CAD as cause of recent symptoms.  Would keep follow-up with PCP for now, however if symptoms worsen he may need further cardiac testing and we could certainly see him back in that case.

## 2019-04-09 NOTE — Telephone Encounter (Signed)
Pt aware and voiced understanding - routed to pcp  

## 2019-04-09 NOTE — Telephone Encounter (Signed)
-----   Message from Satira Sark, MD sent at 04/09/2019 10:00 AM EDT ----- Results reviewed.  Normal LVEF at 60 to 65%.  No major valvular abnormalities to contribute to shortness of breath.

## 2019-05-05 DIAGNOSIS — H2513 Age-related nuclear cataract, bilateral: Secondary | ICD-10-CM | POA: Diagnosis not present

## 2019-05-05 DIAGNOSIS — Q141 Congenital malformation of retina: Secondary | ICD-10-CM | POA: Diagnosis not present

## 2019-05-05 DIAGNOSIS — H524 Presbyopia: Secondary | ICD-10-CM | POA: Diagnosis not present

## 2019-05-05 DIAGNOSIS — H5203 Hypermetropia, bilateral: Secondary | ICD-10-CM | POA: Diagnosis not present

## 2019-05-20 DIAGNOSIS — Z79890 Hormone replacement therapy (postmenopausal): Secondary | ICD-10-CM | POA: Diagnosis not present

## 2019-05-20 DIAGNOSIS — E291 Testicular hypofunction: Secondary | ICD-10-CM | POA: Diagnosis not present

## 2019-06-02 DIAGNOSIS — M9902 Segmental and somatic dysfunction of thoracic region: Secondary | ICD-10-CM | POA: Diagnosis not present

## 2019-06-02 DIAGNOSIS — M9903 Segmental and somatic dysfunction of lumbar region: Secondary | ICD-10-CM | POA: Diagnosis not present

## 2019-06-02 DIAGNOSIS — M546 Pain in thoracic spine: Secondary | ICD-10-CM | POA: Diagnosis not present

## 2019-06-02 DIAGNOSIS — M47816 Spondylosis without myelopathy or radiculopathy, lumbar region: Secondary | ICD-10-CM | POA: Diagnosis not present

## 2019-06-02 DIAGNOSIS — M9901 Segmental and somatic dysfunction of cervical region: Secondary | ICD-10-CM | POA: Diagnosis not present

## 2019-06-02 DIAGNOSIS — M47812 Spondylosis without myelopathy or radiculopathy, cervical region: Secondary | ICD-10-CM | POA: Diagnosis not present

## 2019-07-01 DIAGNOSIS — Z79890 Hormone replacement therapy (postmenopausal): Secondary | ICD-10-CM | POA: Diagnosis not present

## 2019-07-01 DIAGNOSIS — M810 Age-related osteoporosis without current pathological fracture: Secondary | ICD-10-CM | POA: Diagnosis not present

## 2019-07-01 DIAGNOSIS — E291 Testicular hypofunction: Secondary | ICD-10-CM | POA: Diagnosis not present

## 2019-07-30 ENCOUNTER — Other Ambulatory Visit: Payer: Self-pay

## 2019-07-30 DIAGNOSIS — Z20822 Contact with and (suspected) exposure to covid-19: Secondary | ICD-10-CM

## 2019-07-30 DIAGNOSIS — Z20828 Contact with and (suspected) exposure to other viral communicable diseases: Secondary | ICD-10-CM | POA: Diagnosis not present

## 2019-08-01 LAB — NOVEL CORONAVIRUS, NAA: SARS-CoV-2, NAA: NOT DETECTED

## 2019-08-02 DIAGNOSIS — R300 Dysuria: Secondary | ICD-10-CM | POA: Diagnosis not present

## 2019-08-02 DIAGNOSIS — R509 Fever, unspecified: Secondary | ICD-10-CM | POA: Diagnosis not present

## 2019-08-03 DIAGNOSIS — N410 Acute prostatitis: Secondary | ICD-10-CM | POA: Diagnosis not present

## 2019-08-03 DIAGNOSIS — R3915 Urgency of urination: Secondary | ICD-10-CM | POA: Diagnosis not present

## 2019-08-03 DIAGNOSIS — R8271 Bacteriuria: Secondary | ICD-10-CM | POA: Diagnosis not present

## 2019-09-10 DIAGNOSIS — N5201 Erectile dysfunction due to arterial insufficiency: Secondary | ICD-10-CM | POA: Diagnosis not present

## 2019-09-10 DIAGNOSIS — M47812 Spondylosis without myelopathy or radiculopathy, cervical region: Secondary | ICD-10-CM | POA: Diagnosis not present

## 2019-09-10 DIAGNOSIS — M546 Pain in thoracic spine: Secondary | ICD-10-CM | POA: Diagnosis not present

## 2019-09-10 DIAGNOSIS — M9902 Segmental and somatic dysfunction of thoracic region: Secondary | ICD-10-CM | POA: Diagnosis not present

## 2019-09-10 DIAGNOSIS — M9901 Segmental and somatic dysfunction of cervical region: Secondary | ICD-10-CM | POA: Diagnosis not present

## 2019-09-10 DIAGNOSIS — M47816 Spondylosis without myelopathy or radiculopathy, lumbar region: Secondary | ICD-10-CM | POA: Diagnosis not present

## 2019-09-10 DIAGNOSIS — N410 Acute prostatitis: Secondary | ICD-10-CM | POA: Diagnosis not present

## 2019-09-10 DIAGNOSIS — M9903 Segmental and somatic dysfunction of lumbar region: Secondary | ICD-10-CM | POA: Diagnosis not present

## 2019-10-27 DIAGNOSIS — G548 Other nerve root and plexus disorders: Secondary | ICD-10-CM | POA: Diagnosis not present

## 2019-10-27 DIAGNOSIS — L821 Other seborrheic keratosis: Secondary | ICD-10-CM | POA: Diagnosis not present

## 2019-10-27 DIAGNOSIS — L720 Epidermal cyst: Secondary | ICD-10-CM | POA: Diagnosis not present

## 2019-10-27 DIAGNOSIS — L814 Other melanin hyperpigmentation: Secondary | ICD-10-CM | POA: Diagnosis not present

## 2019-10-27 DIAGNOSIS — D485 Neoplasm of uncertain behavior of skin: Secondary | ICD-10-CM | POA: Diagnosis not present

## 2019-11-16 DIAGNOSIS — R69 Illness, unspecified: Secondary | ICD-10-CM | POA: Diagnosis not present

## 2019-12-13 DIAGNOSIS — Z79890 Hormone replacement therapy (postmenopausal): Secondary | ICD-10-CM | POA: Diagnosis not present

## 2019-12-13 DIAGNOSIS — E291 Testicular hypofunction: Secondary | ICD-10-CM | POA: Diagnosis not present

## 2019-12-28 DIAGNOSIS — R972 Elevated prostate specific antigen [PSA]: Secondary | ICD-10-CM | POA: Diagnosis not present

## 2019-12-28 DIAGNOSIS — R351 Nocturia: Secondary | ICD-10-CM | POA: Diagnosis not present

## 2020-02-11 DIAGNOSIS — N390 Urinary tract infection, site not specified: Secondary | ICD-10-CM | POA: Diagnosis not present

## 2020-02-11 DIAGNOSIS — N411 Chronic prostatitis: Secondary | ICD-10-CM | POA: Diagnosis not present

## 2020-02-18 DIAGNOSIS — M9902 Segmental and somatic dysfunction of thoracic region: Secondary | ICD-10-CM | POA: Diagnosis not present

## 2020-02-18 DIAGNOSIS — M9901 Segmental and somatic dysfunction of cervical region: Secondary | ICD-10-CM | POA: Diagnosis not present

## 2020-02-18 DIAGNOSIS — M9903 Segmental and somatic dysfunction of lumbar region: Secondary | ICD-10-CM | POA: Diagnosis not present

## 2020-02-18 DIAGNOSIS — M47812 Spondylosis without myelopathy or radiculopathy, cervical region: Secondary | ICD-10-CM | POA: Diagnosis not present

## 2020-02-18 DIAGNOSIS — M546 Pain in thoracic spine: Secondary | ICD-10-CM | POA: Diagnosis not present

## 2020-02-18 DIAGNOSIS — M47816 Spondylosis without myelopathy or radiculopathy, lumbar region: Secondary | ICD-10-CM | POA: Diagnosis not present

## 2020-04-07 DIAGNOSIS — N411 Chronic prostatitis: Secondary | ICD-10-CM | POA: Diagnosis not present

## 2020-04-07 DIAGNOSIS — D513 Other dietary vitamin B12 deficiency anemia: Secondary | ICD-10-CM | POA: Diagnosis not present

## 2020-05-05 DIAGNOSIS — Z79890 Hormone replacement therapy (postmenopausal): Secondary | ICD-10-CM | POA: Diagnosis not present

## 2020-05-05 DIAGNOSIS — E291 Testicular hypofunction: Secondary | ICD-10-CM | POA: Diagnosis not present

## 2020-05-05 DIAGNOSIS — R972 Elevated prostate specific antigen [PSA]: Secondary | ICD-10-CM | POA: Diagnosis not present

## 2020-05-05 DIAGNOSIS — M810 Age-related osteoporosis without current pathological fracture: Secondary | ICD-10-CM | POA: Diagnosis not present

## 2020-06-13 DIAGNOSIS — N5201 Erectile dysfunction due to arterial insufficiency: Secondary | ICD-10-CM | POA: Diagnosis not present

## 2020-06-13 DIAGNOSIS — R972 Elevated prostate specific antigen [PSA]: Secondary | ICD-10-CM | POA: Diagnosis not present

## 2020-06-13 DIAGNOSIS — E349 Endocrine disorder, unspecified: Secondary | ICD-10-CM | POA: Diagnosis not present

## 2020-07-24 DIAGNOSIS — R972 Elevated prostate specific antigen [PSA]: Secondary | ICD-10-CM | POA: Diagnosis not present

## 2020-08-08 DIAGNOSIS — N5201 Erectile dysfunction due to arterial insufficiency: Secondary | ICD-10-CM | POA: Diagnosis not present

## 2020-08-08 DIAGNOSIS — R972 Elevated prostate specific antigen [PSA]: Secondary | ICD-10-CM | POA: Diagnosis not present

## 2020-08-08 DIAGNOSIS — E349 Endocrine disorder, unspecified: Secondary | ICD-10-CM | POA: Diagnosis not present

## 2020-08-08 DIAGNOSIS — N3000 Acute cystitis without hematuria: Secondary | ICD-10-CM | POA: Diagnosis not present

## 2020-09-11 DIAGNOSIS — R195 Other fecal abnormalities: Secondary | ICD-10-CM | POA: Diagnosis not present

## 2020-09-11 DIAGNOSIS — K625 Hemorrhage of anus and rectum: Secondary | ICD-10-CM | POA: Diagnosis not present

## 2020-09-22 DIAGNOSIS — E291 Testicular hypofunction: Secondary | ICD-10-CM | POA: Diagnosis not present

## 2020-09-22 DIAGNOSIS — E039 Hypothyroidism, unspecified: Secondary | ICD-10-CM | POA: Diagnosis not present

## 2020-09-22 DIAGNOSIS — M9902 Segmental and somatic dysfunction of thoracic region: Secondary | ICD-10-CM | POA: Diagnosis not present

## 2020-09-22 DIAGNOSIS — M810 Age-related osteoporosis without current pathological fracture: Secondary | ICD-10-CM | POA: Diagnosis not present

## 2020-09-22 DIAGNOSIS — M9901 Segmental and somatic dysfunction of cervical region: Secondary | ICD-10-CM | POA: Diagnosis not present

## 2020-09-22 DIAGNOSIS — M9903 Segmental and somatic dysfunction of lumbar region: Secondary | ICD-10-CM | POA: Diagnosis not present

## 2020-09-22 DIAGNOSIS — M47812 Spondylosis without myelopathy or radiculopathy, cervical region: Secondary | ICD-10-CM | POA: Diagnosis not present

## 2020-09-22 DIAGNOSIS — M546 Pain in thoracic spine: Secondary | ICD-10-CM | POA: Diagnosis not present

## 2020-09-22 DIAGNOSIS — Z79890 Hormone replacement therapy (postmenopausal): Secondary | ICD-10-CM | POA: Diagnosis not present

## 2020-09-22 DIAGNOSIS — M47816 Spondylosis without myelopathy or radiculopathy, lumbar region: Secondary | ICD-10-CM | POA: Diagnosis not present

## 2020-10-27 DIAGNOSIS — K921 Melena: Secondary | ICD-10-CM | POA: Diagnosis not present

## 2020-10-27 DIAGNOSIS — K648 Other hemorrhoids: Secondary | ICD-10-CM | POA: Diagnosis not present

## 2020-10-27 DIAGNOSIS — K5730 Diverticulosis of large intestine without perforation or abscess without bleeding: Secondary | ICD-10-CM | POA: Diagnosis not present

## 2020-10-30 DIAGNOSIS — L720 Epidermal cyst: Secondary | ICD-10-CM | POA: Diagnosis not present

## 2020-10-30 DIAGNOSIS — L821 Other seborrheic keratosis: Secondary | ICD-10-CM | POA: Diagnosis not present

## 2020-10-30 DIAGNOSIS — X32XXXS Exposure to sunlight, sequela: Secondary | ICD-10-CM | POA: Diagnosis not present

## 2020-10-30 DIAGNOSIS — D225 Melanocytic nevi of trunk: Secondary | ICD-10-CM | POA: Diagnosis not present

## 2020-10-30 DIAGNOSIS — L814 Other melanin hyperpigmentation: Secondary | ICD-10-CM | POA: Diagnosis not present

## 2020-10-30 DIAGNOSIS — L601 Onycholysis: Secondary | ICD-10-CM | POA: Diagnosis not present

## 2020-11-15 DIAGNOSIS — L720 Epidermal cyst: Secondary | ICD-10-CM | POA: Diagnosis not present

## 2020-12-01 DIAGNOSIS — K648 Other hemorrhoids: Secondary | ICD-10-CM | POA: Diagnosis not present

## 2020-12-12 DIAGNOSIS — H00015 Hordeolum externum left lower eyelid: Secondary | ICD-10-CM | POA: Diagnosis not present

## 2020-12-12 DIAGNOSIS — H25813 Combined forms of age-related cataract, bilateral: Secondary | ICD-10-CM | POA: Diagnosis not present

## 2020-12-12 DIAGNOSIS — G43109 Migraine with aura, not intractable, without status migrainosus: Secondary | ICD-10-CM | POA: Diagnosis not present

## 2020-12-13 DIAGNOSIS — Z0100 Encounter for examination of eyes and vision without abnormal findings: Secondary | ICD-10-CM | POA: Diagnosis not present

## 2021-01-03 DIAGNOSIS — K648 Other hemorrhoids: Secondary | ICD-10-CM | POA: Diagnosis not present

## 2021-01-25 DIAGNOSIS — K648 Other hemorrhoids: Secondary | ICD-10-CM | POA: Diagnosis not present

## 2021-02-08 DIAGNOSIS — Z79890 Hormone replacement therapy (postmenopausal): Secondary | ICD-10-CM | POA: Diagnosis not present

## 2021-02-08 DIAGNOSIS — M810 Age-related osteoporosis without current pathological fracture: Secondary | ICD-10-CM | POA: Diagnosis not present

## 2021-02-08 DIAGNOSIS — E291 Testicular hypofunction: Secondary | ICD-10-CM | POA: Diagnosis not present

## 2021-02-08 DIAGNOSIS — D518 Other vitamin B12 deficiency anemias: Secondary | ICD-10-CM | POA: Diagnosis not present

## 2021-02-08 DIAGNOSIS — E559 Vitamin D deficiency, unspecified: Secondary | ICD-10-CM | POA: Diagnosis not present

## 2021-02-20 DIAGNOSIS — K625 Hemorrhage of anus and rectum: Secondary | ICD-10-CM | POA: Diagnosis not present

## 2021-03-02 DIAGNOSIS — K625 Hemorrhage of anus and rectum: Secondary | ICD-10-CM | POA: Diagnosis not present

## 2021-03-02 DIAGNOSIS — K648 Other hemorrhoids: Secondary | ICD-10-CM | POA: Diagnosis not present

## 2021-03-02 DIAGNOSIS — K5730 Diverticulosis of large intestine without perforation or abscess without bleeding: Secondary | ICD-10-CM | POA: Diagnosis not present

## 2021-03-08 DIAGNOSIS — K648 Other hemorrhoids: Secondary | ICD-10-CM | POA: Diagnosis not present

## 2021-06-28 DIAGNOSIS — E559 Vitamin D deficiency, unspecified: Secondary | ICD-10-CM | POA: Diagnosis not present

## 2021-06-28 DIAGNOSIS — Z13220 Encounter for screening for lipoid disorders: Secondary | ICD-10-CM | POA: Diagnosis not present

## 2021-06-28 DIAGNOSIS — Z79890 Hormone replacement therapy (postmenopausal): Secondary | ICD-10-CM | POA: Diagnosis not present

## 2021-06-28 DIAGNOSIS — M810 Age-related osteoporosis without current pathological fracture: Secondary | ICD-10-CM | POA: Diagnosis not present

## 2021-06-28 DIAGNOSIS — E291 Testicular hypofunction: Secondary | ICD-10-CM | POA: Diagnosis not present

## 2021-09-19 DIAGNOSIS — S338XXA Sprain of other parts of lumbar spine and pelvis, initial encounter: Secondary | ICD-10-CM | POA: Diagnosis not present

## 2021-09-19 DIAGNOSIS — M47812 Spondylosis without myelopathy or radiculopathy, cervical region: Secondary | ICD-10-CM | POA: Diagnosis not present

## 2021-09-19 DIAGNOSIS — M9902 Segmental and somatic dysfunction of thoracic region: Secondary | ICD-10-CM | POA: Diagnosis not present

## 2021-09-19 DIAGNOSIS — M9901 Segmental and somatic dysfunction of cervical region: Secondary | ICD-10-CM | POA: Diagnosis not present

## 2021-09-19 DIAGNOSIS — M9903 Segmental and somatic dysfunction of lumbar region: Secondary | ICD-10-CM | POA: Diagnosis not present

## 2021-09-19 DIAGNOSIS — M546 Pain in thoracic spine: Secondary | ICD-10-CM | POA: Diagnosis not present

## 2023-06-26 ENCOUNTER — Other Ambulatory Visit: Payer: Self-pay | Admitting: Nurse Practitioner

## 2023-06-26 DIAGNOSIS — R972 Elevated prostate specific antigen [PSA]: Secondary | ICD-10-CM

## 2023-08-09 ENCOUNTER — Ambulatory Visit
Admission: RE | Admit: 2023-08-09 | Discharge: 2023-08-09 | Disposition: A | Discharge status: Home / Self Care | Payer: Medicare Other | Source: Ambulatory Visit | Attending: Nurse Practitioner | Admitting: Nurse Practitioner

## 2023-08-09 DIAGNOSIS — R972 Elevated prostate specific antigen [PSA]: Secondary | ICD-10-CM

## 2023-08-09 MED ORDER — GADOPICLENOL 0.5 MMOL/ML IV SOLN
9.0000 mL | Freq: Once | INTRAVENOUS | Status: AC | PRN
  Administered 2023-08-09: 9 mL via INTRAVENOUS
